# Patient Record
Sex: Female | Born: 1955 | Race: White | Hispanic: No | Marital: Married
Health system: Southern US, Community
[De-identification: ages and names within clinical notes are randomized; demographics above are authoritative.]

---

## 2010-09-01 ENCOUNTER — Ambulatory Visit: Payer: Self-pay | Admitting: Interventional Radiology

## 2010-09-01 ENCOUNTER — Emergency Department (HOSPITAL_BASED_OUTPATIENT_CLINIC_OR_DEPARTMENT_OTHER): Admission: EM | Admit: 2010-09-01 | Discharge: 2010-09-01 | Payer: Self-pay | Admitting: Emergency Medicine

## 2010-10-19 ENCOUNTER — Encounter
Admission: RE | Admit: 2010-10-19 | Discharge: 2010-10-19 | Payer: Self-pay | Source: Home / Self Care | Attending: Orthopedic Surgery | Admitting: Orthopedic Surgery

## 2010-11-16 ENCOUNTER — Encounter
Admission: RE | Admit: 2010-11-16 | Discharge: 2010-11-16 | Payer: Self-pay | Source: Home / Self Care | Attending: Orthopedic Surgery | Admitting: Orthopedic Surgery

## 2011-07-30 IMAGING — CR DG ANKLE COMPLETE 3+V*R*
3 series · 3 of 3 positions shown · non-contrast
Comparison: Right ankle films of 10/19/2010

CLINICAL DATA: History of fracture of the distal right fibula,
follow-up

RIGHT ANKLE - COMPLETE 3+ VIEW

[view not recorded (1 of 3)]
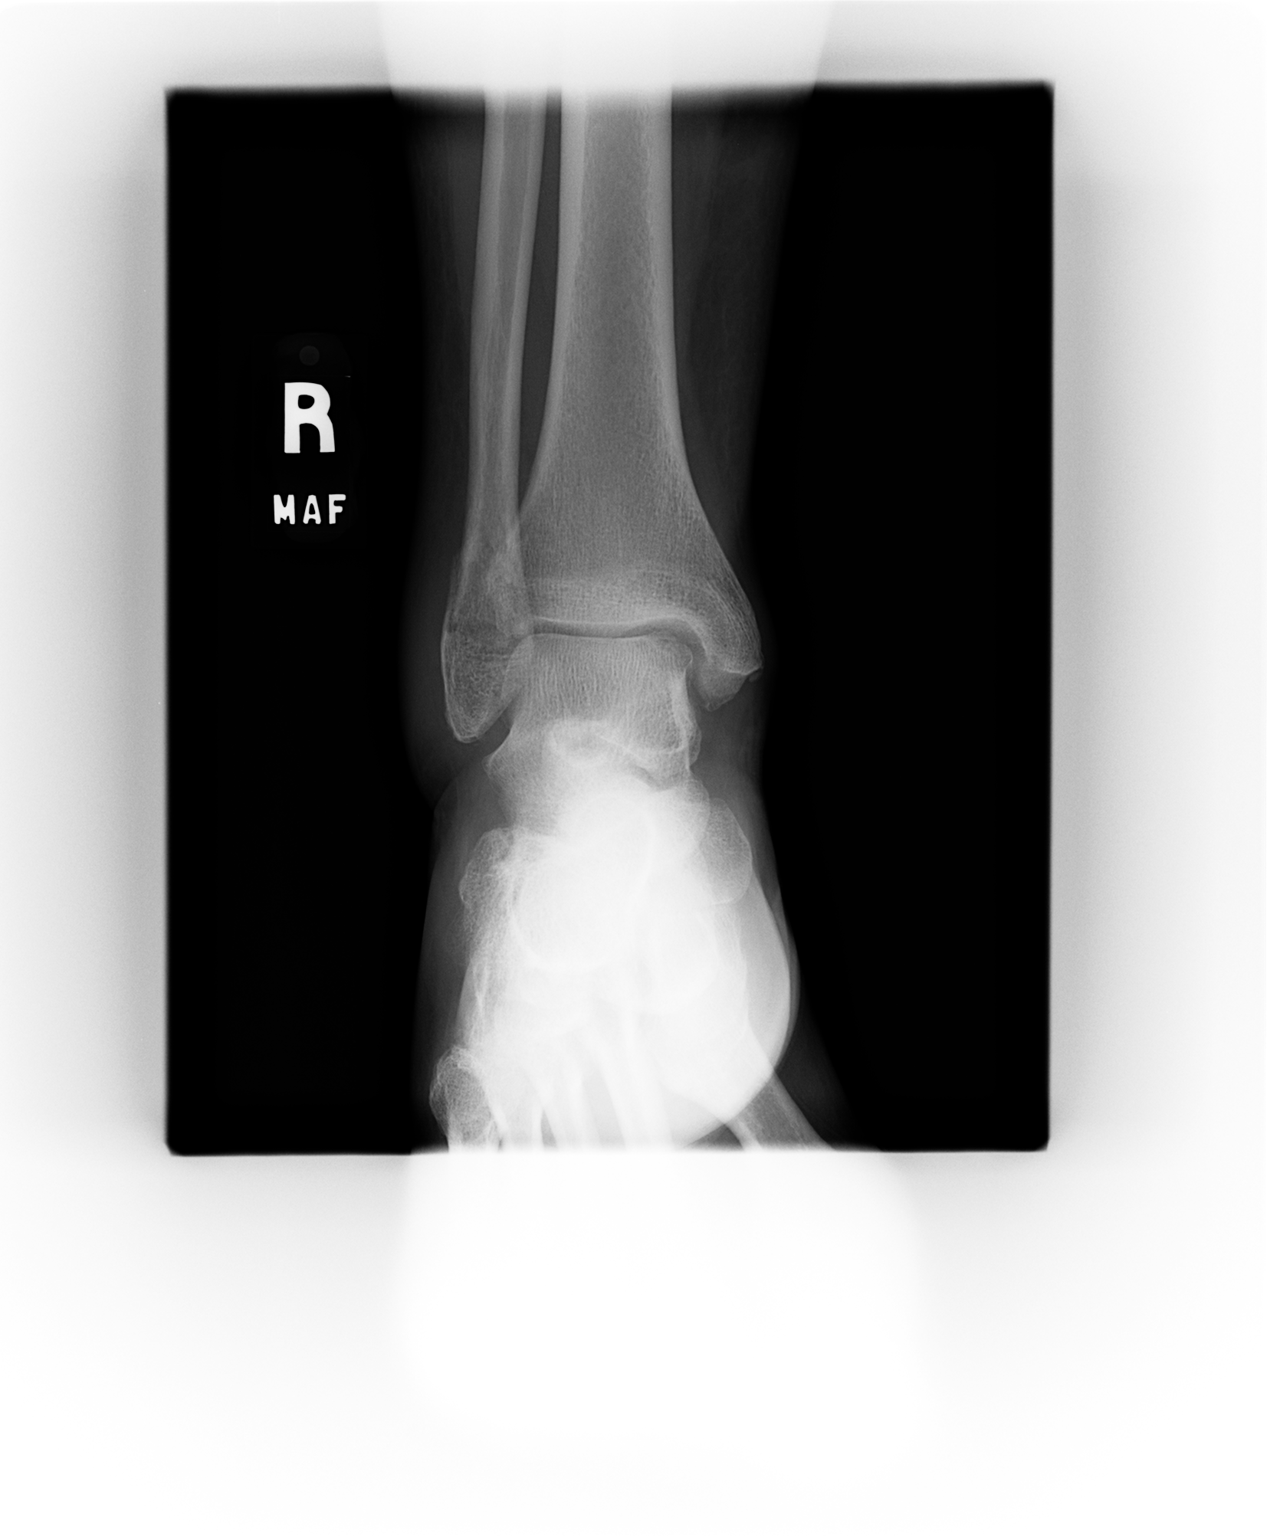

[view not recorded (2 of 3)]
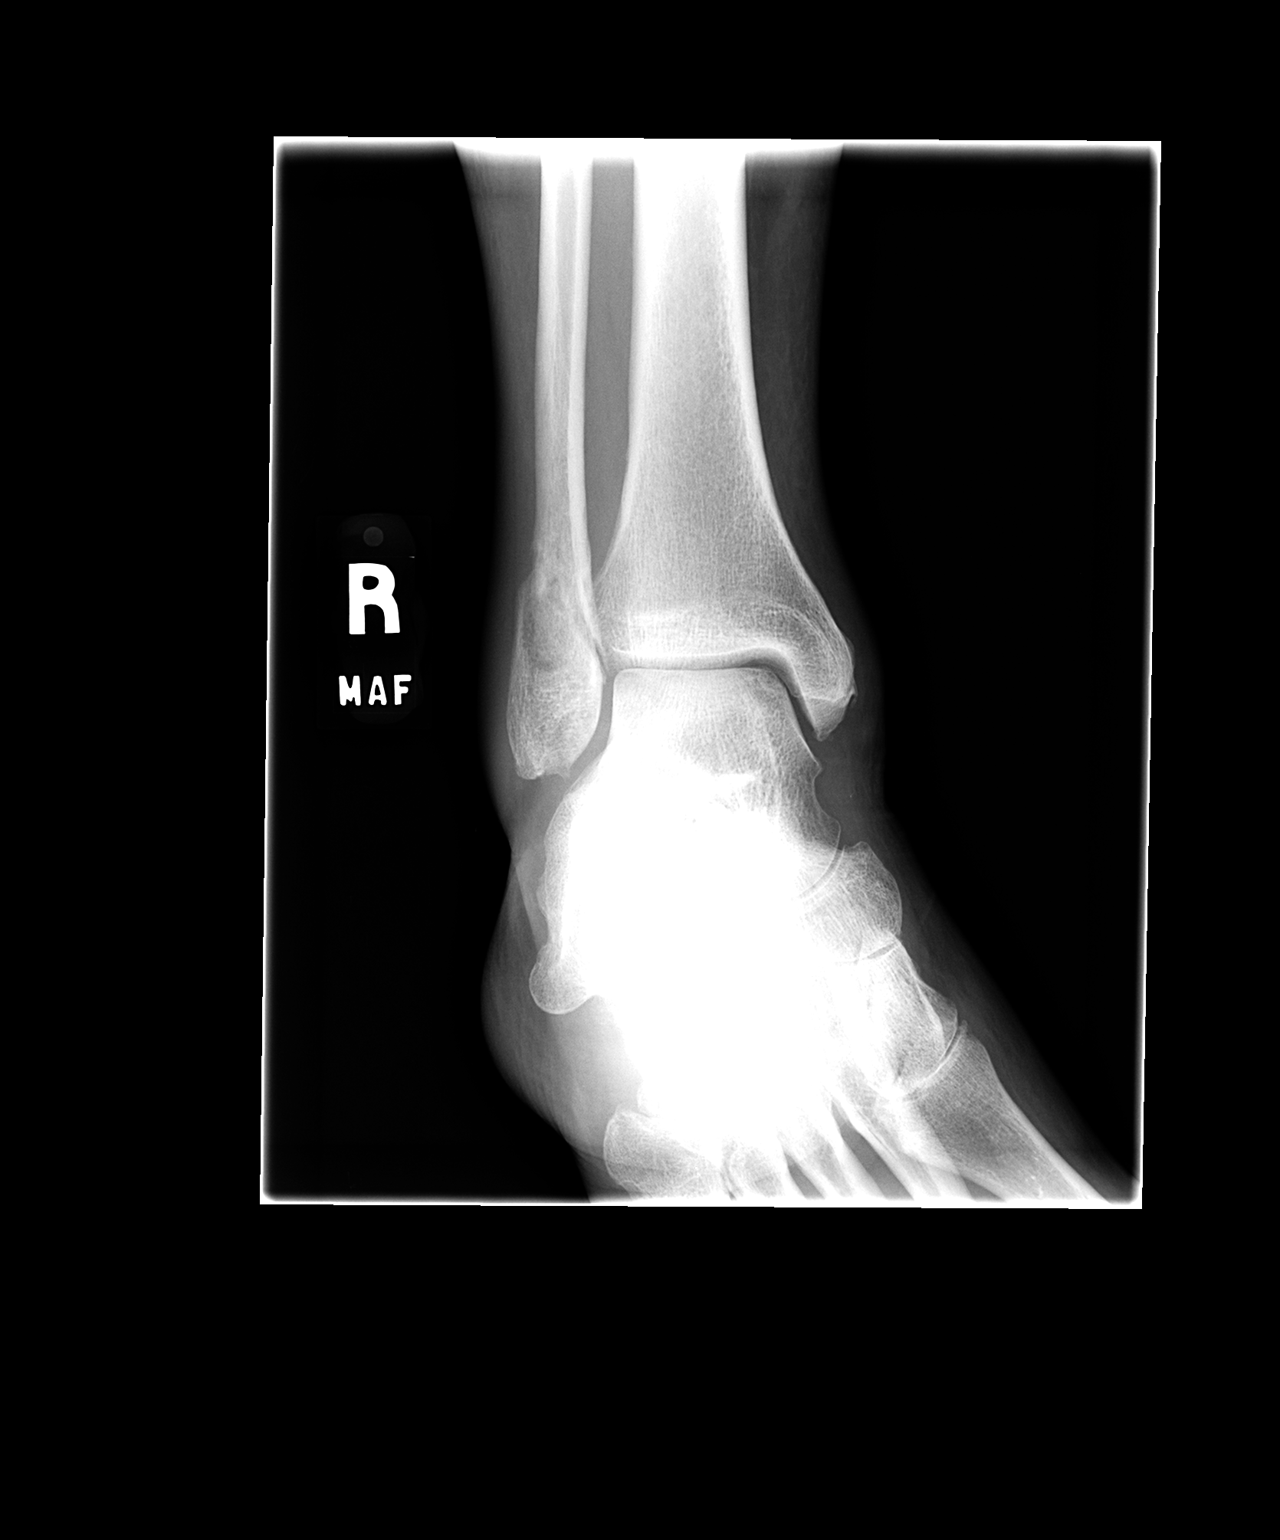

[view not recorded (3 of 3)]
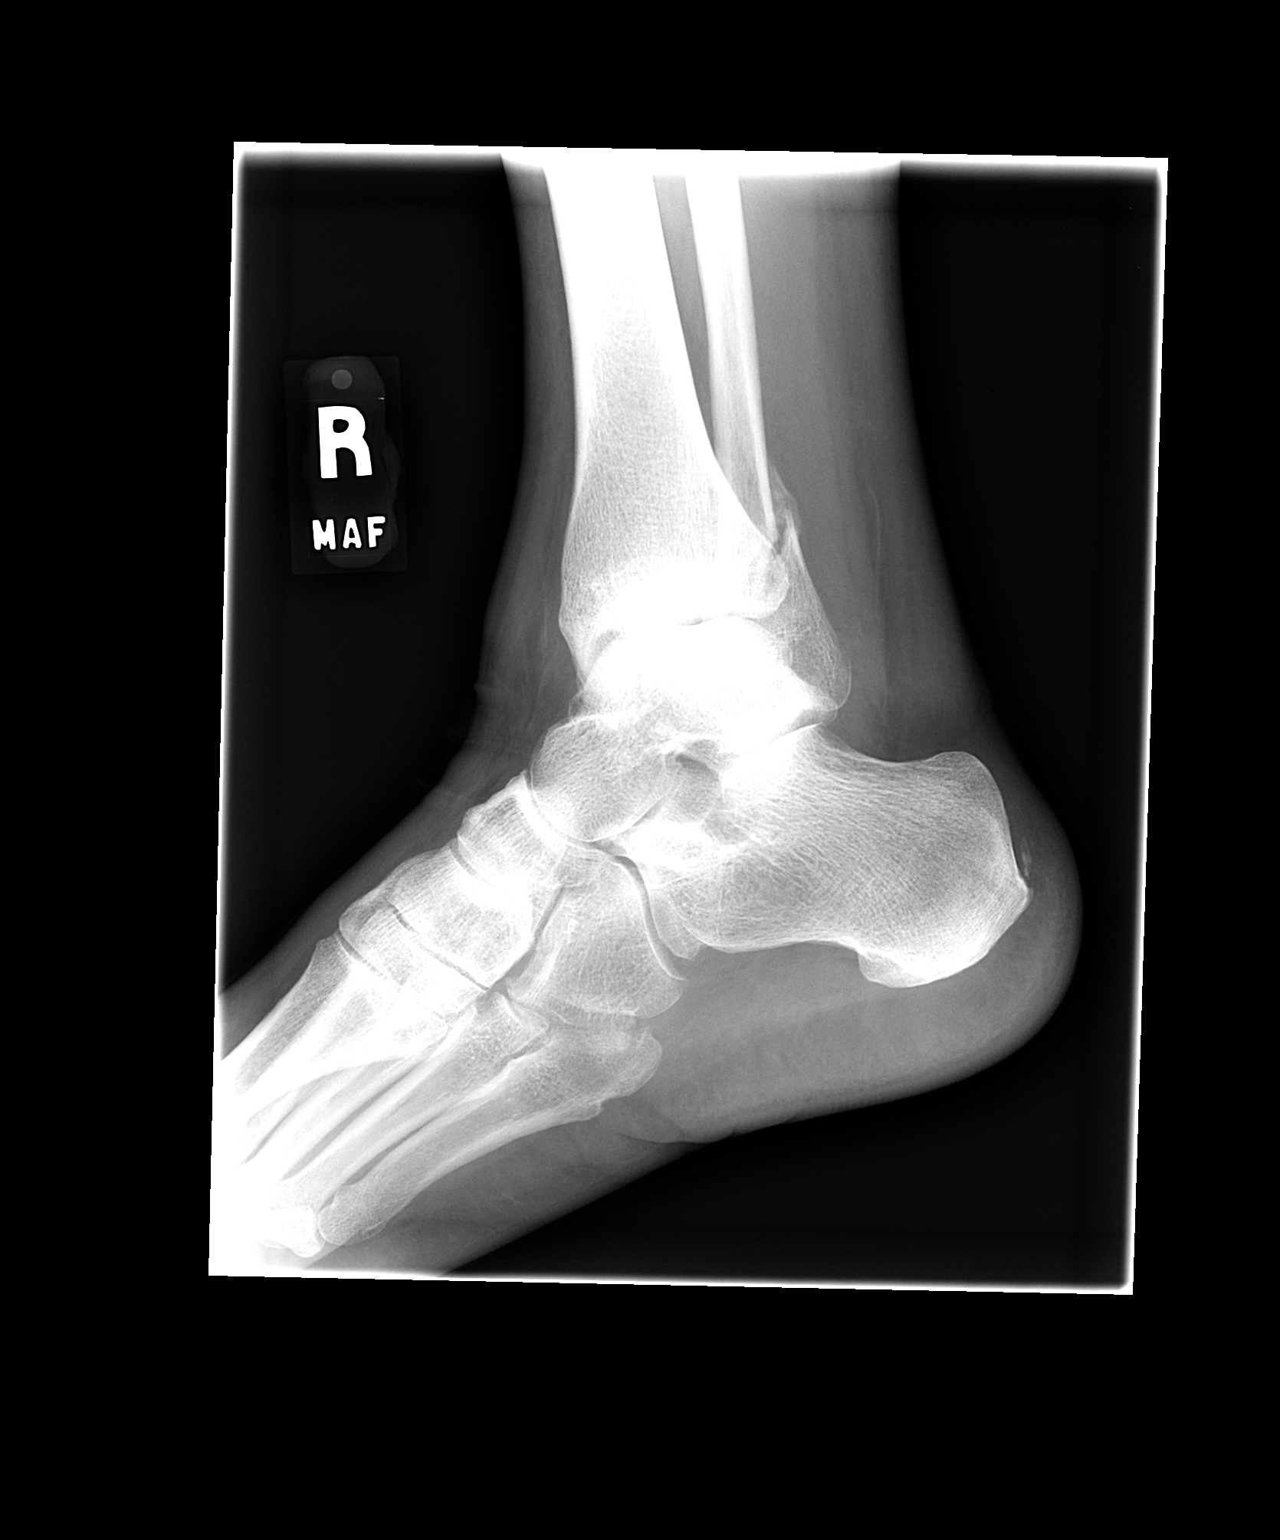

[3 of 3 positions shown; findings below may reference images not displayed]

FINDINGS: There is some callus around the oblique fracture of the
distal right fibula.  No change in position has occurred.  The
ankle joint appears normal.
IMPRESSION: No change in oblique fracture of the distal right fibula with some
callus formation present.

## 2017-07-21 ENCOUNTER — Telehealth: Payer: Self-pay

## 2017-07-21 NOTE — Telephone Encounter (Signed)
Attempted to call  Patient regarding not receiving a referral for her. Both numbers listed in chart are incorrect.  Minerva Endsiffany N Meredeth Furber RN
